# Patient Record
Sex: Male | Born: 2000 | Race: White | Hispanic: No | Marital: Single | Smoking: Never smoker
Health system: Southern US, Community
[De-identification: ages and names within clinical notes are randomized; demographics above are authoritative.]

## PROBLEM LIST (undated history)

## (undated) DIAGNOSIS — F909 Attention-deficit hyperactivity disorder, unspecified type: Secondary | ICD-10-CM

## (undated) HISTORY — DX: Attention-deficit hyperactivity disorder, unspecified type: F90.9

---
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
## 2021-11-02 ENCOUNTER — Other Ambulatory Visit: Payer: Self-pay | Admitting: Orthopedic Surgery

## 2021-11-02 DIAGNOSIS — M79644 Pain in right finger(s): Secondary | ICD-10-CM

## 2021-11-10 ENCOUNTER — Ambulatory Visit
Admission: RE | Admit: 2021-11-10 | Discharge: 2021-11-10 | Disposition: A | Discharge status: Home / Self Care | Payer: Worker's Compensation | Source: Ambulatory Visit | Attending: Orthopedic Surgery | Admitting: Orthopedic Surgery

## 2021-11-10 DIAGNOSIS — M79644 Pain in right finger(s): Secondary | ICD-10-CM

## 2022-05-03 ENCOUNTER — Encounter: Payer: Self-pay | Admitting: Family Medicine

## 2022-05-23 IMAGING — MR MR [PERSON_NAME]*[PERSON_NAME]* W/O CM
5 series · 40 of 40 positions shown · non-contrast
Comparison: None.

CLINICAL DATA: Right hand pain since injury 09/24/2021.

EXAM:
MRI OF THE RIGHT HAND WITHOUT CONTRAST
TECHNIQUE: Multiplanar, multisequence MR imaging of the right hand was
performed. No intravenous contrast was administered.

[Series 4: T1 · coronal · right · 2.0mm · 0.49mm/px · 6 of 15 slices shown]
[im 1/15]
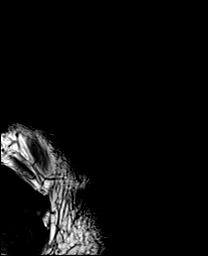
[im 3/15]
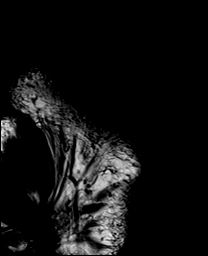
[im 6/15]
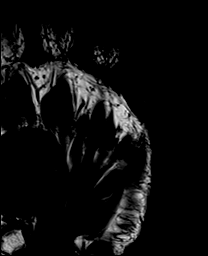
[im 9/15]
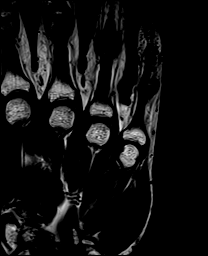
[im 12/15]
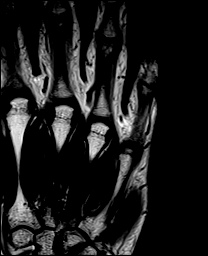
[im 15/15]
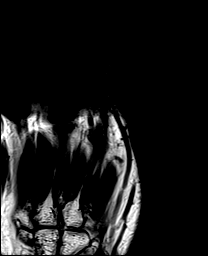

[Series 5: T2 fat-sat · coronal · right · 2.0mm · 0.24mm/px · 5 of 15 slices shown (1 of 2)]
[im 1/15]
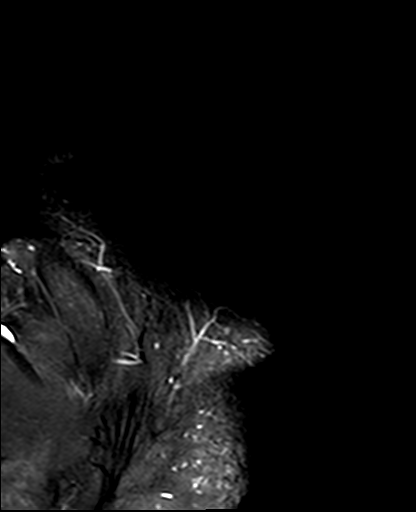
[im 4/15]
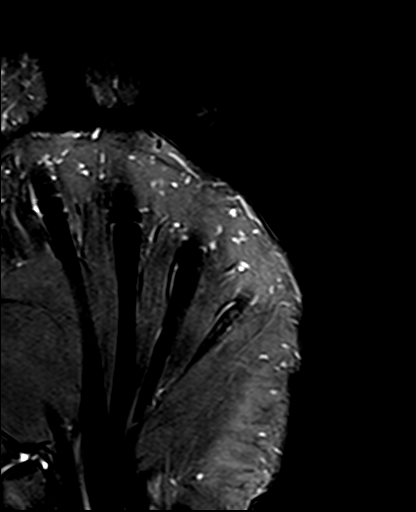
[im 8/15]
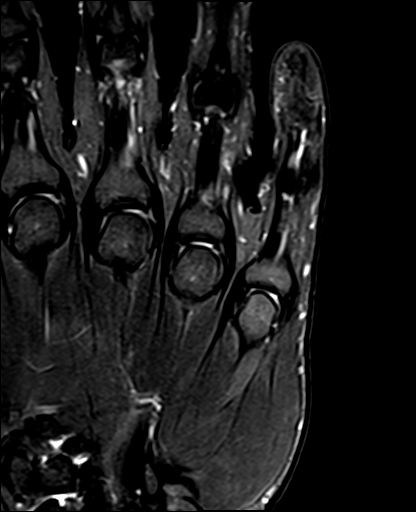
[im 11/15]
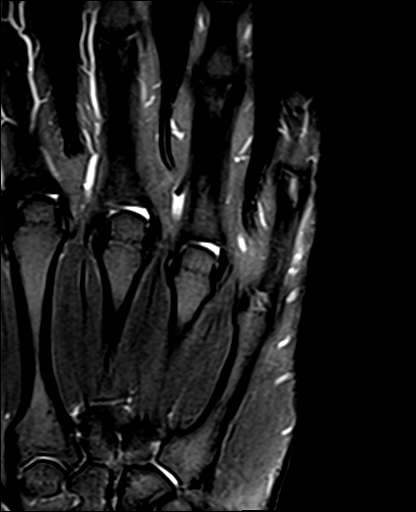
[im 15/15]
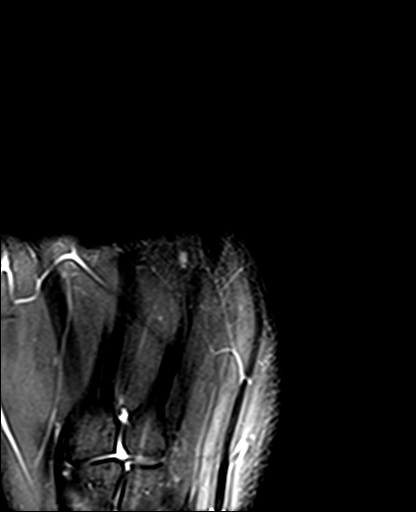

[Series 6: t1_ax · axial · right · 3.0mm · 0.31mm/px · z∈[-39,+80]mm · 12 of 35 slices shown]
[im 1/35]
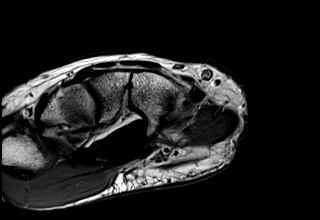
[im 4/35]
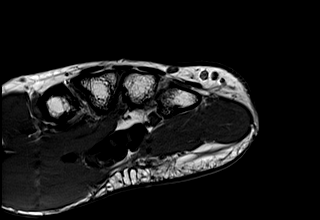
[im 7/35]
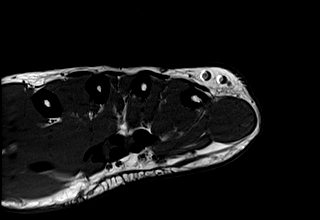
[im 10/35]
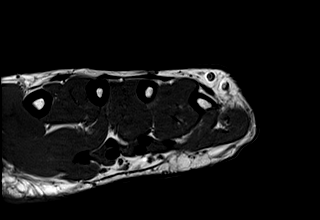
[im 13/35]
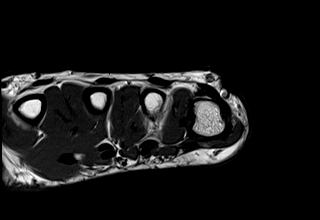
[im 16/35]
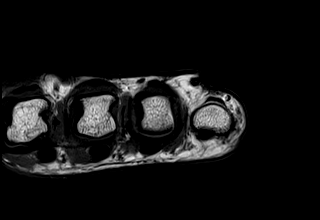
[im 19/35]
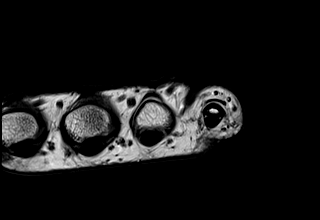
[im 22/35]
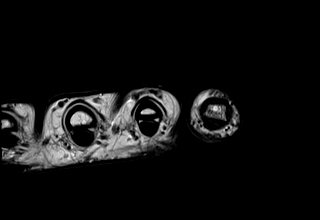
[im 25/35]
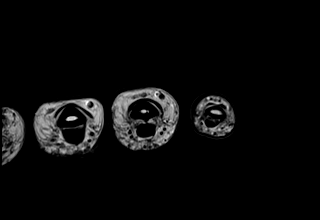
[im 28/35]
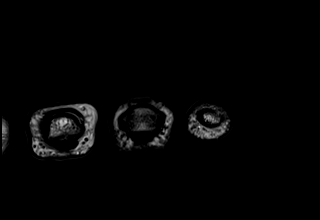
[im 31/35]
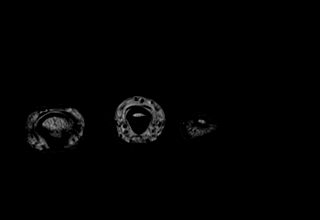
[im 35/35]
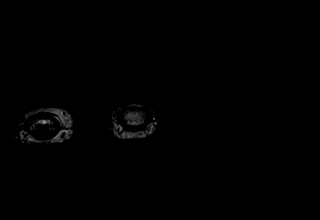

[Series 7: T2 fat-sat · axial · right · 3.0mm · 0.31mm/px · z∈[-39,+80]mm · 12 of 35 slices shown (2 of 2)]
[im 1/35]
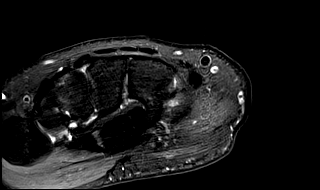
[im 4/35]
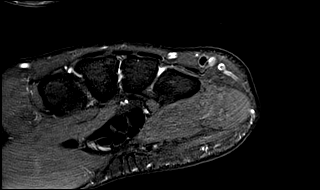
[im 7/35]
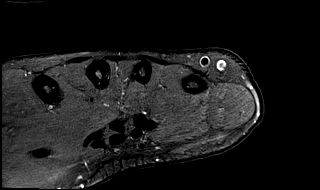
[im 10/35]
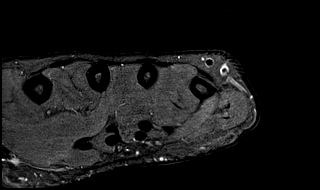
[im 13/35]
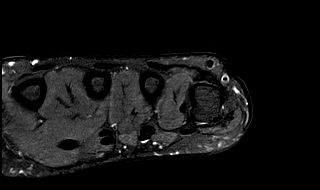
[im 16/35]
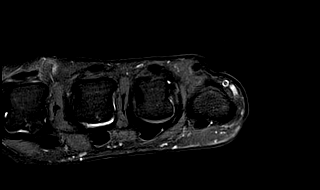
[im 19/35]
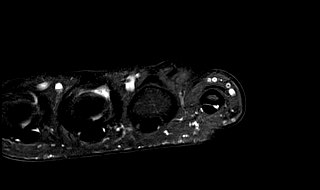
[im 22/35]
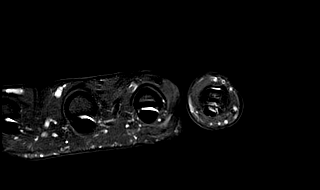
[im 25/35]
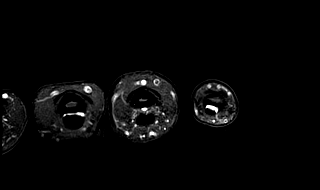
[im 28/35]
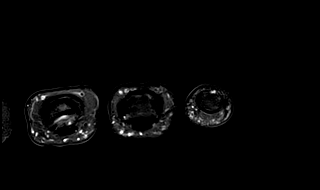
[im 31/35]
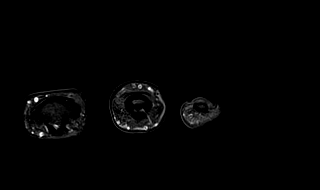
[im 35/35]
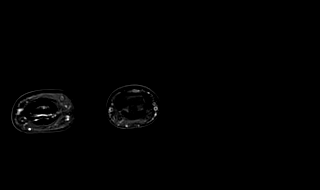

[Series 8: PD fat-sat · sagittal · right · 3.0mm · 0.30mm/px · 5 of 14 slices shown]
[im 1/14]
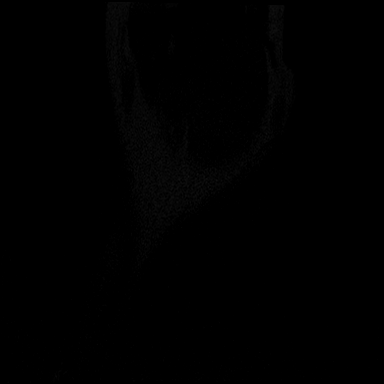
[im 4/14]
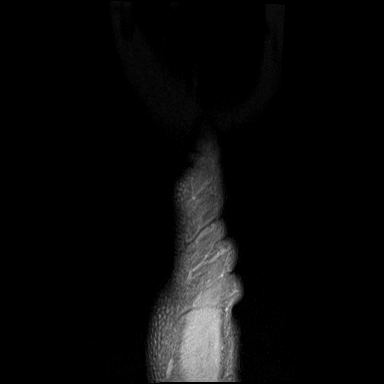
[im 7/14]
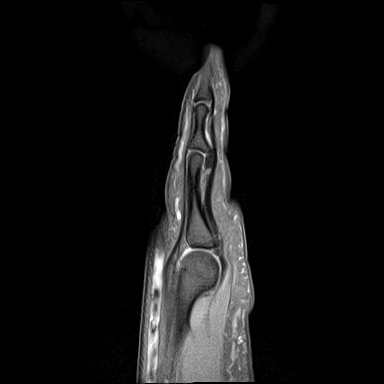
[im 10/14]
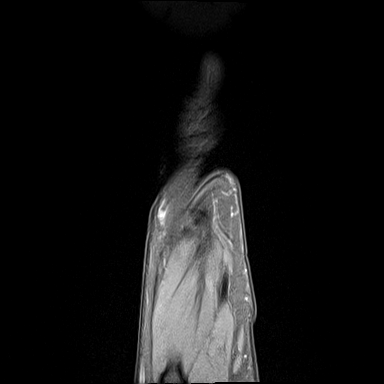
[im 14/14]
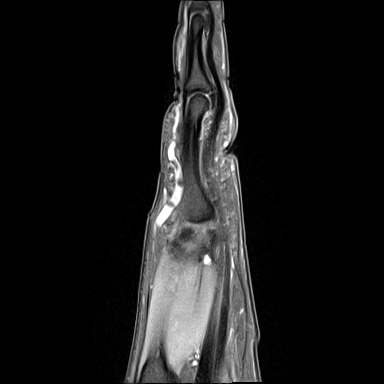

[40 of 40 positions shown; findings below may reference images not displayed]

FINDINGS: Bones/Joint/Cartilage

No marrow signal abnormality. No fracture or dislocation. Normal
alignment. No joint effusion. No erosive changes. No periostitis.

Ligaments

Collateral ligaments are intact.

Muscles and Tendons
Flexor and extensor compartment tendons are intact. Muscles are
normal.

Soft tissue
No fluid collection or hematoma.  No soft tissue mass.
IMPRESSION: 1. No acute injury of the right hand.

## 2022-08-18 DIAGNOSIS — J069 Acute upper respiratory infection, unspecified: Secondary | ICD-10-CM | POA: Diagnosis not present

## 2022-08-18 DIAGNOSIS — E669 Obesity, unspecified: Secondary | ICD-10-CM | POA: Diagnosis not present

## 2022-08-18 DIAGNOSIS — Z6833 Body mass index (BMI) 33.0-33.9, adult: Secondary | ICD-10-CM | POA: Diagnosis not present

## 2022-08-27 DIAGNOSIS — R030 Elevated blood-pressure reading, without diagnosis of hypertension: Secondary | ICD-10-CM | POA: Diagnosis not present

## 2022-08-27 DIAGNOSIS — Z6833 Body mass index (BMI) 33.0-33.9, adult: Secondary | ICD-10-CM | POA: Diagnosis not present

## 2022-08-27 DIAGNOSIS — E669 Obesity, unspecified: Secondary | ICD-10-CM | POA: Diagnosis not present

## 2022-08-27 DIAGNOSIS — Z20822 Contact with and (suspected) exposure to covid-19: Secondary | ICD-10-CM | POA: Diagnosis not present

## 2022-09-09 ENCOUNTER — Ambulatory Visit: Payer: Self-pay | Admitting: Family Medicine

## 2022-10-05 ENCOUNTER — Encounter: Payer: Self-pay | Admitting: Family Medicine

## 2022-10-05 ENCOUNTER — Ambulatory Visit: Payer: Commercial Managed Care - PPO | Admitting: Family Medicine

## 2022-10-05 VITALS — BP 123/87 | HR 84 | Temp 98.6°F | Ht 72.0 in | Wt 234.0 lb

## 2022-10-05 DIAGNOSIS — F32A Depression, unspecified: Secondary | ICD-10-CM | POA: Diagnosis not present

## 2022-10-05 MED ORDER — SERTRALINE HCL 50 MG PO TABS: 50.0000 mg | ORAL_TABLET | Freq: Every day | ORAL | 1 refills | Status: AC

## 2022-10-05 NOTE — Progress Notes (Signed)
   Subjective:  Patient ID: Victor Tate, male    DOB: 2001/07/18  Age: 22 y.o. MRN: [id number]  CC: Chief Complaint  Patient presents with   Establish Care    HPI:  22 year old male presents to establish care.  Patient declines COVID-vaccine.  Had flu vaccine earlier in the season.  He states that he is doing okay.  He does report that he has been struggling after he has fiance had 2 miscarriages.  He states that he has not been the same since.  Struggling with depression.  He would like to discuss this today.  He is interested in pharmacotherapy.  Social Hx   Social History   Socioeconomic History   Marital status: Single    Spouse name: Not on file   Number of children: Not on file   Years of education: Not on file   Highest education level: Not on file  Occupational History   Not on file  Tobacco Use   Smoking status: Not on file   Smokeless tobacco: Not on file  Substance and Sexual Activity   Alcohol use: Not on file   Drug use: Not on file   Sexual activity: Not on file  Other Topics Concern   Not on file  Social History Narrative   Not on file   Social Determinants of Health   Financial Resource Strain: Not on file  Food Insecurity: Not on file  Transportation Needs: Not on file  Physical Activity: Not on file  Stress: Not on file  Social Connections: Not on file    Review of Systems Per HPI  Objective:  BP 123/87   Pulse 84   Temp 98.6 F (37 C)   Ht 6' (1.829 m)   Wt 234 lb (106.1 kg)   SpO2 99%   BMI 31.74 kg/m      10/05/2022    9:42 AM 12/20/2020    7:54 PM  BP/Weight  Systolic BP [id number]  Diastolic BP 87 88  Wt. (Lbs) 234   BMI 31.74 kg/m2     Physical Exam Vitals and nursing note reviewed.  Constitutional:      Appearance: Normal appearance.  HENT:     Head: Normocephalic and atraumatic.  Cardiovascular:     Rate and Rhythm: Normal rate and regular rhythm.  Pulmonary:     Effort: Pulmonary effort is normal.     Breath  sounds: Normal breath sounds.  Neurological:     Mental Status: He is alert.  Psychiatric:     Comments: Flat affect.  Depressed mood.     Assessment & Plan:   Problem List Items Addressed This Visit       Other   Depression - Primary    PHQ-9 score 14.  After discussion, starting Zoloft.      Relevant Medications   sertraline (ZOLOFT) 50 MG tablet    Meds ordered this encounter  Medications   sertraline (ZOLOFT) 50 MG tablet    Sig: Take 1 tablet (50 mg total) by mouth daily.    Dispense:  90 tablet    Refill:  1    Follow-up:  Return in about 1 year (around 10/05/2023).  [place]

## 2022-10-05 NOTE — Assessment & Plan Note (Signed)
PHQ-9 score 14.  After discussion, starting Zoloft.

## 2022-10-05 NOTE — Patient Instructions (Signed)
Follow up annually.  Call with concerns.  Take care  Dr. Lydiann Bonifas  

## 2022-11-09 ENCOUNTER — Ambulatory Visit: Payer: Commercial Managed Care - PPO | Admitting: Family Medicine

## 2023-02-23 ENCOUNTER — Encounter: Payer: Self-pay | Admitting: Family Medicine

## 2023-02-23 ENCOUNTER — Ambulatory Visit: Payer: Commercial Managed Care - PPO | Admitting: Family Medicine

## 2023-02-23 ENCOUNTER — Ambulatory Visit (INDEPENDENT_AMBULATORY_CARE_PROVIDER_SITE_OTHER): Payer: Commercial Managed Care - PPO | Admitting: Family Medicine

## 2023-02-23 VITALS — BP 119/77 | HR 65 | Temp 98.6°F | Ht 72.0 in | Wt 235.4 lb

## 2023-02-23 DIAGNOSIS — T63461A Toxic effect of venom of wasps, accidental (unintentional), initial encounter: Secondary | ICD-10-CM

## 2023-02-23 MED ORDER — FAMOTIDINE 20 MG PO TABS: 20.0000 mg | ORAL_TABLET | Freq: Two times a day (BID) | ORAL | 0 refills | Status: AC

## 2023-02-23 MED ORDER — PREDNISONE 10 MG PO TABS: ORAL_TABLET | ORAL | 0 refills | Status: AC

## 2023-02-23 MED ORDER — CETIRIZINE HCL 10 MG PO TABS: 10.0000 mg | ORAL_TABLET | Freq: Every day | ORAL | 0 refills | Status: AC

## 2023-02-23 NOTE — Assessment & Plan Note (Signed)
Patient is overall well-appearing.  Does have a large area on the left flank from sting.  I suspect that he is experiencing serum sickness and thus reason he feels so poorly.  Advised that he can continue to test for COVID at home as he has recent exposure.  Patient on prednisone, Zyrtec, and Pepcid.

## 2023-02-23 NOTE — Progress Notes (Signed)
Subjective:  Patient ID: Victor Tate, male    DOB: 2000-07-22  Age: 22 y.o. MRN: [id number]  CC:  Yellow jacket sting(s); Not feeling well   HPI:  22 year old male presents for evaluation of the above.  Patient reports that he got stung by yellow jackets on Monday.  He has not been feeling well since that time.  He has had redness, warmth, and itching at the site.  He is also just generally felt poor.  He reports generalized bodyaches, congestion.  Negative home COVID testing.  No relieving factors.  No other complaints.  Patient Active Problem List   Diagnosis Date Noted   Yellow jacket sting 02/23/2023   Depression 10/05/2022    Social Hx   Social History   Socioeconomic History   Marital status: Single    Spouse name: Not on file   Number of children: Not on file   Years of education: Not on file   Highest education level: Not on file  Occupational History   Not on file  Tobacco Use   Smoking status: Not on file   Smokeless tobacco: Not on file  Substance and Sexual Activity   Alcohol use: Not on file   Drug use: Not on file   Sexual activity: Not on file  Other Topics Concern   Not on file  Social History Narrative   Not on file   Social Determinants of Health   Financial Resource Strain: Not on file  Food Insecurity: Not on file  Transportation Needs: Not on file  Physical Activity: Not on file  Stress: Not on file  Social Connections: Not on file    Review of Systems Per HPI  Objective:  BP 119/77   Pulse 65   Temp 98.6 F (37 C)   Ht 6' (1.829 m)   Wt 235 lb 6.4 oz (106.8 kg)   SpO2 96%   BMI 31.93 kg/m      02/23/2023    9:40 AM 10/05/2022    9:42 AM 12/20/2020    7:54 PM  BP/Weight  Systolic BP 119 123 144  Diastolic BP 77 87 88  Wt. (Lbs) 235.4 234   BMI 31.93 kg/m2 31.74 kg/m2     Physical Exam Constitutional:      General: He is not in acute distress.    Appearance: Normal appearance.  HENT:     Head: Normocephalic and  atraumatic.  Cardiovascular:     Rate and Rhythm: Normal rate and regular rhythm.  Pulmonary:     Effort: Pulmonary effort is normal.     Breath sounds: Normal breath sounds. No wheezing, rhonchi or rales.  Skin:         Comments: Labeled location with a large area of erythema and warmth.  Neurological:     Mental Status: He is alert.     Assessment & Plan:   Problem List Items Addressed This Visit       Other   Yellow jacket sting - Primary    Patient is overall well-appearing.  Does have a large area on the left flank from sting.  I suspect that he is experiencing serum sickness and thus reason he feels so poorly.  Advised that he can continue to test for COVID at home as he has recent exposure.  Patient on prednisone, Zyrtec, and Pepcid.       Meds ordered this encounter  Medications   predniSONE (DELTASONE) 10 MG tablet    Sig: 50 mg daily  x 2 days, then 40 mg daily x 2 days, then 30 mg daily x 2 days, then 20 mg daily x 2 days, then 10 mg daily x 2 days.    Dispense:  30 tablet    Refill:  0   cetirizine (ZYRTEC ALLERGY) 10 MG tablet    Sig: Take 1 tablet (10 mg total) by mouth daily.    Dispense:  14 tablet    Refill:  0   famotidine (PEPCID) 20 MG tablet    Sig: Take 1 tablet (20 mg total) by mouth 2 (two) times daily.    Dispense:  30 tablet    Refill:  0    Follow-up:  Return if symptoms worsen or fail to improve.  Everlene Other DO [facility]

## 2023-02-23 NOTE — Patient Instructions (Signed)
Rest.  Medication as prescribed.  Call with concerns.

## 2023-04-06 ENCOUNTER — Encounter: Payer: Self-pay | Admitting: Nurse Practitioner

## 2023-04-06 ENCOUNTER — Telehealth: Payer: Commercial Managed Care - PPO | Admitting: Nurse Practitioner

## 2023-04-06 DIAGNOSIS — J069 Acute upper respiratory infection, unspecified: Secondary | ICD-10-CM

## 2023-04-06 NOTE — Progress Notes (Signed)
Virtual Visit via Video Note  I connected with Victor Tate on 04/06/23 at  1:40 PM EDT by a video enabled telemedicine application and verified that I am speaking with the correct person using two identifiers.  Location: Patient: car Provider: office   I discussed the limitations of evaluation and management by telemedicine and the availability of in person appointments. The patient expressed understanding and agreed to proceed.  History of Present Illness: Presents for complaints of a 3-day history of congestion and cough.  Started as a sore throat.  Cough mainly at night, limited during the day.  No chest pain shortness of breath or wheezing.  No sinus headache or pressure.  No fever.  No ear pain.  Taking fluids well.  Voiding normal limit.  Has tried Tylenol Mucinex and ibuprofen for his symptoms.   Observations/Objective: NAD. Alert, oriented. No tachypnea or cough. Mild head congestion noted.   Assessment and Plan: Viral URI with cough   Follow Up Instructions: Continue current regimen for congestion and cough. Warning signs reviewed.  Call back if any new or worsening symptoms.   I discussed the assessment and treatment plan with the patient. The patient was provided an opportunity to ask questions and all were answered. The patient agreed with the plan and demonstrated an understanding of the instructions.   The patient was advised to call back or seek an in-person evaluation if the symptoms worsen or if the condition fails to improve as anticipated.  I provided 10 minutes of non-face-to-face time during this encounter.   Campbell Riches, NP
